# Patient Record
Sex: Female | Born: 1965 | Race: Black or African American | Hispanic: No | Smoking: Never smoker
Health system: Southern US, Community
[De-identification: ages and names within clinical notes are randomized; demographics above are authoritative.]

## PROBLEM LIST (undated history)

## (undated) DIAGNOSIS — Z8041 Family history of malignant neoplasm of ovary: Secondary | ICD-10-CM

## (undated) DIAGNOSIS — G43909 Migraine, unspecified, not intractable, without status migrainosus: Secondary | ICD-10-CM

## (undated) HISTORY — PX: ABDOMINAL HYSTERECTOMY: SHX81

## (undated) HISTORY — DX: Family history of malignant neoplasm of ovary: Z80.41

---

## 2007-03-15 ENCOUNTER — Ambulatory Visit: Payer: Self-pay | Admitting: Obstetrics & Gynecology

## 2008-07-06 ENCOUNTER — Ambulatory Visit: Payer: Self-pay | Admitting: Internal Medicine

## 2008-07-31 ENCOUNTER — Ambulatory Visit: Payer: Self-pay | Admitting: Internal Medicine

## 2008-08-06 ENCOUNTER — Ambulatory Visit: Payer: Self-pay | Admitting: Internal Medicine

## 2008-12-14 ENCOUNTER — Ambulatory Visit: Payer: Self-pay | Admitting: Obstetrics & Gynecology

## 2008-12-20 ENCOUNTER — Ambulatory Visit: Payer: Self-pay | Admitting: Obstetrics & Gynecology

## 2010-11-26 ENCOUNTER — Ambulatory Visit: Payer: Self-pay | Admitting: Obstetrics & Gynecology

## 2012-02-26 ENCOUNTER — Ambulatory Visit: Payer: Self-pay | Admitting: Otolaryngology

## 2013-01-23 ENCOUNTER — Ambulatory Visit: Payer: Self-pay | Admitting: Internal Medicine

## 2015-02-05 ENCOUNTER — Encounter: Payer: Self-pay | Admitting: *Deleted

## 2015-02-05 ENCOUNTER — Emergency Department
Admission: EM | Admit: 2015-02-05 | Discharge: 2015-02-05 | Disposition: A | Discharge status: Home / Self Care | Payer: Federal, State, Local not specified - PPO | Attending: Emergency Medicine | Admitting: Emergency Medicine

## 2015-02-05 DIAGNOSIS — G43809 Other migraine, not intractable, without status migrainosus: Secondary | ICD-10-CM | POA: Diagnosis not present

## 2015-02-05 DIAGNOSIS — R51 Headache: Secondary | ICD-10-CM | POA: Diagnosis present

## 2015-02-05 HISTORY — DX: Migraine, unspecified, not intractable, without status migrainosus: G43.909

## 2015-02-05 MED ORDER — SUMATRIPTAN SUCCINATE 6 MG/0.5ML ~~LOC~~ SOLN
6.0000 mg | Freq: Once | SUBCUTANEOUS | Status: AC
  Administered 2015-02-05: 6 mg via SUBCUTANEOUS
  Filled 2015-02-05: qty 0.5

## 2015-02-05 MED ORDER — METOCLOPRAMIDE HCL 5 MG/ML IJ SOLN
10.0000 mg | Freq: Once | INTRAMUSCULAR | Status: AC
  Administered 2015-02-05: 10 mg via INTRAVENOUS
  Filled 2015-02-05: qty 2

## 2015-02-05 MED ORDER — KETOROLAC TROMETHAMINE 30 MG/ML IJ SOLN
30.0000 mg | Freq: Once | INTRAMUSCULAR | Status: AC
  Administered 2015-02-05: 30 mg via INTRAVENOUS
  Filled 2015-02-05: qty 1

## 2015-02-05 MED ORDER — SODIUM CHLORIDE 0.9 % IV BOLUS (SEPSIS)
1000.0000 mL | Freq: Once | INTRAVENOUS | Status: AC
  Administered 2015-02-05: 1000 mL via INTRAVENOUS

## 2015-02-05 NOTE — ED Notes (Signed)
Headache for couple of days   No relief with po meds  No fever or trauma

## 2015-02-05 NOTE — ED Provider Notes (Signed)
[facility] Emergency Department Provider Note  ____________________________________________  Time seen: Approximately 4:58 PM  I have reviewed the triage vital signs and the nursing notes.   HISTORY  Chief Complaint Migraine    HPI Nichole Avila is a 49 y.o. female patient states she's having a headache for the last 3 days. Patient state the headache became worse this morning and has submitted to a migraine headache she's had years ago. Patient said this headache is unresolve ibuprofen. Patient states the headache is located in the front and right side. Patient stated there is photophobia associated with this headache but denies any nausea vomiting or vertigo. Patient was was seen at urgent care clinic and they sent her to the ER. Patient rating the pain as a 10 over 10. Patient is wearing sunshades inside.   Past Medical History  Diagnosis Date  . Migraine     There are no active problems to display for this patient.   Past Surgical History  Procedure Laterality Date  . Abdominal hysterectomy      No current outpatient prescriptions on file.  Allergies Codeine  No family history on file.  Social History History  Substance Use Topics  . Smoking status: Never Smoker   . Smokeless tobacco: Not on file  . Alcohol Use: No    Review of Systems Constitutional: No fever/chills Eyes: No visual changes. Patient complaining of photophobia. ENT: No sore throat. Cardiovascular: Denies chest pain. Respiratory: Denies shortness of breath. Gastrointestinal: No abdominal pain.  No nausea, no vomiting.  No diarrhea.  No constipation. Genitourinary: Negative for dysuria. Musculoskeletal: Negative for back pain. Skin: Negative for rash. Neurological: Positive for headaches, but denies focal weakness or numbness. Hematological/Lymphatic: Allergic/Immunilogical: Codeine 10-point ROS otherwise  negative.  ____________________________________________   PHYSICAL EXAM:  VITAL SIGNS: ED Triage Vitals  Enc Vitals Group     BP 02/05/15 1554 125/67 mmHg     Pulse Rate 02/05/15 1554 63     Resp 02/05/15 1554 18     Temp 02/05/15 1554 97.9 F (36.6 C)     Temp Source 02/05/15 1554 Oral     SpO2 02/05/15 1554 99 %     Weight 02/05/15 1554 151 lb (68.493 kg)     Height 02/05/15 1554 5\' 5"  (1.651 m)     Head Cir --      Peak Flow --      Pain Score 02/05/15 1555 10     Pain Loc --      Pain Edu? --      Excl. in [place]? --     Constitutional: Alert and oriented. Well appearing and in no acute distress. Eyes: Conjunctivae are normal. PERRL. EOMI. Head: Atraumatic. Nose: No congestion/rhinnorhea. Mouth/Throat: Mucous membranes are moist.  Oropharynx non-erythematous. Neck: No stridor. No cervical spine tenderness to palpation. Hematological/Lymphatic/Immunilogical: No cervical lymphadenopathy. Cardiovascular: Normal rate, regular rhythm. Grossly normal heart sounds.  Good peripheral circulation. Respiratory: Normal respiratory effort.  No retractions. Lungs CTAB. Gastrointestinal: Soft and nontender. No distention. No abdominal bruits. No CVA tenderness. Musculoskeletal: No lower extremity tenderness nor edema.  No joint effusions. Neurologic:  Normal speech and language. No gross focal neurologic deficits are appreciated. No gait instability. Skin:  Skin is warm, dry and intact. No rash noted. Psychiatric: Mood and affect are normal. Speech and behavior are normal.  ____________________________________________   LABS (all labs ordered are listed, but only abnormal results are displayed)  Labs Reviewed - No data to display ____________________________________________  EKG  ____________________________________________  RADIOLOGY   ____________________________________________   PROCEDURES  Procedure(s) performed: None  Critical Care performed:  No  ____________________________________________   INITIAL IMPRESSION / ASSESSMENT AND PLAN / ED COURSE  Pertinent labs & imaging results that were available during my care of the patient were reviewed by me and considered in my medical decision making (see chart for details).  Migraine headache almost completely resolved status post IV of Toradol and Reglan. She also given Imitrex subcutaneous. Patient states no medication needed for discharge since the headaches are very infrequent. Patient advised to follow-up family doctor determine if condition worsens. ____________________________________________   FINAL CLINICAL IMPRESSION(S) / ED DIAGNOSES  Final diagnoses:  Other type of migraine      Sable Feil, PA-C 02/05/15 [place], MD 02/06/15 [phone]

## 2015-02-05 NOTE — ED Notes (Signed)
Pt states she is having a migraine. She states she has had a "nagging" headache since Sunday, but this morning she felt like it turned into a migraine. She took two doses of ibuprofen without relief. C/o frontal and right headache, associated with photophobia. Denies n/v or dizziness. 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

## 2015-02-05 NOTE — ED Notes (Signed)
Pt here from [facility] with c/o headache, reports 10/10 pain. Hx of migraines, reports taking 800mg  of ibu 1200 with no relief.

## 2015-02-05 NOTE — Discharge Instructions (Signed)

## 2015-04-11 ENCOUNTER — Encounter: Payer: Self-pay | Admitting: *Deleted

## 2015-04-15 ENCOUNTER — Other Ambulatory Visit: Payer: Self-pay | Admitting: Obstetrics & Gynecology

## 2015-04-15 DIAGNOSIS — R928 Other abnormal and inconclusive findings on diagnostic imaging of breast: Secondary | ICD-10-CM

## 2015-04-16 ENCOUNTER — Other Ambulatory Visit: Payer: Self-pay | Admitting: *Deleted

## 2015-04-16 ENCOUNTER — Ambulatory Visit
Admission: RE | Admit: 2015-04-16 | Discharge: 2015-04-16 | Disposition: A | Discharge status: Home / Self Care | Payer: Federal, State, Local not specified - PPO | Source: Ambulatory Visit | Attending: Obstetrics & Gynecology | Admitting: Obstetrics & Gynecology

## 2015-04-16 ENCOUNTER — Other Ambulatory Visit: Payer: Self-pay | Admitting: Obstetrics & Gynecology

## 2015-04-16 ENCOUNTER — Ambulatory Visit
Admission: RE | Admit: 2015-04-16 | Discharge: 2015-04-16 | Disposition: A | Discharge status: Home / Self Care | Payer: Self-pay | Source: Ambulatory Visit | Attending: *Deleted | Admitting: *Deleted

## 2015-04-16 DIAGNOSIS — R928 Other abnormal and inconclusive findings on diagnostic imaging of breast: Secondary | ICD-10-CM

## 2015-04-16 DIAGNOSIS — R921 Mammographic calcification found on diagnostic imaging of breast: Secondary | ICD-10-CM | POA: Diagnosis not present

## 2015-04-16 DIAGNOSIS — Z9289 Personal history of other medical treatment: Secondary | ICD-10-CM

## 2015-04-18 ENCOUNTER — Other Ambulatory Visit: Payer: Self-pay | Admitting: Obstetrics & Gynecology

## 2015-04-18 DIAGNOSIS — R928 Other abnormal and inconclusive findings on diagnostic imaging of breast: Secondary | ICD-10-CM

## 2015-04-18 DIAGNOSIS — R921 Mammographic calcification found on diagnostic imaging of breast: Secondary | ICD-10-CM

## 2015-04-22 ENCOUNTER — Ambulatory Visit
Admission: RE | Admit: 2015-04-22 | Discharge: 2015-04-22 | Disposition: A | Discharge status: Home / Self Care | Payer: Federal, State, Local not specified - PPO | Source: Ambulatory Visit | Attending: Obstetrics & Gynecology | Admitting: Obstetrics & Gynecology

## 2015-04-22 DIAGNOSIS — R921 Mammographic calcification found on diagnostic imaging of breast: Secondary | ICD-10-CM

## 2015-04-22 DIAGNOSIS — R928 Other abnormal and inconclusive findings on diagnostic imaging of breast: Secondary | ICD-10-CM | POA: Diagnosis present

## 2015-04-22 HISTORY — PX: BREAST BIOPSY: SHX20

## 2015-04-23 LAB — SURGICAL PATHOLOGY

## 2015-04-29 ENCOUNTER — Ambulatory Visit: Payer: Self-pay | Admitting: General Surgery

## 2015-05-06 ENCOUNTER — Ambulatory Visit: Payer: Self-pay | Admitting: General Surgery

## 2015-07-11 NOTE — Addendum Note (Signed)
Encounter addended by: Wayne Both on: 07/11/2015  1:35 PM<BR>     Documentation filed: Charges VN

## 2015-11-15 ENCOUNTER — Other Ambulatory Visit: Payer: Self-pay | Admitting: Internal Medicine

## 2015-11-15 DIAGNOSIS — G44229 Chronic tension-type headache, not intractable: Secondary | ICD-10-CM

## 2015-11-27 ENCOUNTER — Ambulatory Visit: Payer: Federal, State, Local not specified - PPO

## 2015-12-04 ENCOUNTER — Ambulatory Visit: Payer: Federal, State, Local not specified - PPO

## 2016-10-14 DIAGNOSIS — M7711 Lateral epicondylitis, right elbow: Secondary | ICD-10-CM | POA: Insufficient documentation

## 2017-04-30 ENCOUNTER — Ambulatory Visit (INDEPENDENT_AMBULATORY_CARE_PROVIDER_SITE_OTHER): Payer: Federal, State, Local not specified - PPO | Admitting: Obstetrics & Gynecology

## 2017-04-30 ENCOUNTER — Encounter: Payer: Self-pay | Admitting: Obstetrics & Gynecology

## 2017-04-30 VITALS — BP 110/60 | Ht 65.0 in | Wt 142.0 lb

## 2017-04-30 DIAGNOSIS — Z124 Encounter for screening for malignant neoplasm of cervix: Secondary | ICD-10-CM

## 2017-04-30 DIAGNOSIS — Z1231 Encounter for screening mammogram for malignant neoplasm of breast: Secondary | ICD-10-CM

## 2017-04-30 DIAGNOSIS — Z0000 Encounter for general adult medical examination without abnormal findings: Secondary | ICD-10-CM | POA: Insufficient documentation

## 2017-04-30 DIAGNOSIS — Z8041 Family history of malignant neoplasm of ovary: Secondary | ICD-10-CM | POA: Insufficient documentation

## 2017-04-30 DIAGNOSIS — Z01419 Encounter for gynecological examination (general) (routine) without abnormal findings: Secondary | ICD-10-CM

## 2017-04-30 DIAGNOSIS — Z1211 Encounter for screening for malignant neoplasm of colon: Secondary | ICD-10-CM

## 2017-04-30 DIAGNOSIS — Z1239 Encounter for other screening for malignant neoplasm of breast: Secondary | ICD-10-CM

## 2017-04-30 NOTE — Progress Notes (Signed)
HPI:      [name] is a 51 y.o. G2P2002 who LMP was in the past as she is s/p [facility], she presents today for her annual examination.  The patient has no complaints today. The patient is sexually active. Herlast pap: was normal and last mammogram: approximate date 2016, abnormal calcification and had biopsy then and was normal.  The patient does perform self breast exams.  There is notable family history of breast or ovarian cancer in her family. The patient is not taking hormone replacement therapy. Patient denies post-menopausal vaginal bleeding.   Mild vasomotor sx's.  The patient has regular exercise: yes. The patient denies current symptoms of depression.    GYN Hx: Last Colonoscopy:never ago.  Last DEXA: never ago.    PMHx: Past Medical History:  Diagnosis Date  . Migraine    Past Surgical History:  Procedure Laterality Date  . ABDOMINAL HYSTERECTOMY    . BREAST BIOPSY Left 04/22/15   path pending   Family History  Problem Relation Age of Onset  . Ovarian cancer Mother 26   Social History  Substance Use Topics  . Smoking status: Never Smoker  . Smokeless tobacco: Never Used  . Alcohol use No   No current outpatient prescriptions on file. Allergies: Codeine  Review of Systems  Constitutional: Negative for chills, fever and malaise/fatigue.  HENT: Negative for congestion, sinus pain and sore throat.   Eyes: Negative for blurred vision and pain.  Respiratory: Negative for cough and wheezing.   Cardiovascular: Negative for chest pain and leg swelling.  Gastrointestinal: Negative for abdominal pain, constipation, diarrhea, heartburn, nausea and vomiting.  Genitourinary: Negative for dysuria, frequency, hematuria and urgency.  Musculoskeletal: Negative for back pain, joint pain, myalgias and neck pain.  Skin: Negative for itching and rash.  Neurological: Negative for dizziness, tremors and weakness.  Endo/Heme/Allergies: Does not bruise/bleed easily.    Psychiatric/Behavioral: Negative for depression. The patient is not nervous/anxious and does not have insomnia.     Objective: BP 110/60   Ht 5\' 5"  (1.651 m)   Wt 142 lb (64.4 kg)   BMI 23.63 kg/m   Filed Weights   04/30/17 0804  Weight: 142 lb (64.4 kg)   Body mass index is 23.63 kg/m. Physical Exam  Constitutional: She is oriented to person, place, and time. She appears well-developed and well-nourished. No distress.  Genitourinary: Rectum normal and vagina normal. Pelvic exam was performed with patient supine. There is no rash or lesion on the right labia. There is no rash or lesion on the left labia. Vagina exhibits no lesion. No bleeding in the vagina. Right adnexum does not display mass and does not display tenderness. Left adnexum does not display mass and does not display tenderness. Cervix does not exhibit motion tenderness, lesion or polyp.  Genitourinary Comments: Absent Uterus Normal cervix  HENT:  Head: Normocephalic and atraumatic. Head is without laceration.  Right Ear: Hearing normal.  Left Ear: Hearing normal.  Nose: No epistaxis.  No foreign bodies.  Mouth/Throat: Uvula is midline, oropharynx is clear and moist and mucous membranes are normal.  Eyes: Pupils are equal, round, and reactive to light.  Neck: Normal range of motion. Neck supple. No thyromegaly present.  Cardiovascular: Normal rate and regular rhythm.  Exam reveals no gallop and no friction rub.   No murmur heard. Pulmonary/Chest: Effort normal and breath sounds normal. No respiratory distress. She has no wheezes. Right breast exhibits no mass, no skin change and no tenderness. Left  breast exhibits no mass, no skin change and no tenderness.  Abdominal: Soft. Bowel sounds are normal. She exhibits no distension. There is no tenderness. There is no rebound.  Musculoskeletal: Normal range of motion.  Neurological: She is alert and oriented to person, place, and time. No cranial nerve deficit.  Skin: Skin is  warm and dry.  Psychiatric: She has a normal mood and affect. Judgment normal.  Vitals reviewed.   Assessment: Annual Exam 1. Annual physical exam   2. Screening for breast cancer   3. Screening for cervical cancer   4. Screen for colon cancer     Plan:            1.  Cervical Screening-  Pap smear done today, Pap smear schedule reviewed with patient  2. Breast screening- Exam annually and mammogram scheduled soon  3. Colonoscopy every 10 years, Hemoccult testing after age 62 Sch soon  4. Labs managed by PCP  5. Counseling for hormonal therapy: none, no change in therapy today    F/U  Return in about 1 year (around 04/30/2018) for Annual.  Barnett Applebaum, MD, [facility], [facility] 04/30/2017  8:21 AM

## 2017-04-30 NOTE — Patient Instructions (Addendum)
PAP every three years Mammogram every year    Call [phone] to schedule at [facility] Colonoscopy every 10 years    Will schedule with Dr Allen Norris or one of his partners Labs yearly (with PCP)

## 2017-05-04 LAB — IGP, APTIMA HPV
HPV Aptima: NEGATIVE
PAP SMEAR COMMENT: 0

## 2017-05-09 LAB — FECAL OCCULT BLOOD, IMMUNOCHEMICAL: Fecal Occult Bld: NEGATIVE

## 2017-05-09 LAB — SPECIMEN STATUS REPORT

## 2017-05-25 ENCOUNTER — Encounter: Payer: Self-pay | Admitting: Obstetrics and Gynecology

## 2017-06-08 ENCOUNTER — Ambulatory Visit
Admission: RE | Admit: 2017-06-08 | Discharge: 2017-06-08 | Disposition: A | Discharge status: Home / Self Care | Payer: Federal, State, Local not specified - PPO | Source: Ambulatory Visit | Attending: Obstetrics & Gynecology | Admitting: Obstetrics & Gynecology

## 2017-06-08 DIAGNOSIS — Z1231 Encounter for screening mammogram for malignant neoplasm of breast: Secondary | ICD-10-CM | POA: Insufficient documentation

## 2017-06-08 DIAGNOSIS — Z1239 Encounter for other screening for malignant neoplasm of breast: Secondary | ICD-10-CM

## 2017-06-09 ENCOUNTER — Encounter: Payer: Self-pay | Admitting: Obstetrics & Gynecology

## 2017-06-24 ENCOUNTER — Telehealth: Payer: Self-pay

## 2017-06-24 NOTE — Telephone Encounter (Signed)
Gastroenterology Pre-Procedure Review  Request Date:  Requesting Physician: Dr. Vicente Males   PATIENT REVIEW QUESTIONS: The patient responded to the following health history questions as indicated:    1. Are you having any GI issues? No  2. Do you have a personal history of Polyps? No  3. Do you have a family history of Colon Cancer or Polyps? No  4. Diabetes Mellitus? No  5. Joint replacements in the past 12 months? No  6. Major health problems in the past 3 months? No  7. Any artificial heart valves, MVP, or defibrillator? No     MEDICATIONS & ALLERGIES:    Patient reports the following regarding taking any anticoagulation/antiplatelet therapy:   Plavix, Coumadin, Eliquis, Xarelto, Lovenox, Pradaxa, Brilinta, or Effient? No  Aspirin? No   Patient confirms/reports the following medications:  No current outpatient medications on file.   No current facility-administered medications for this visit.     Patient confirms/reports the following allergies:  Allergies  Allergen Reactions  . Codeine Other (See Comments)    Pt does not remember    No orders of the defined types were placed in this encounter.   AUTHORIZATION INFORMATION Primary Insurance: 1D#: Group #:  Secondary Insurance: 1D#: Group #:  SCHEDULE INFORMATION: Date: 08/27/17 Time: Location: [place]

## 2017-06-28 ENCOUNTER — Other Ambulatory Visit: Payer: Self-pay

## 2017-06-28 DIAGNOSIS — Z1211 Encounter for screening for malignant neoplasm of colon: Secondary | ICD-10-CM

## 2017-08-27 ENCOUNTER — Ambulatory Visit: Payer: Federal, State, Local not specified - PPO | Admitting: Anesthesiology

## 2017-08-27 ENCOUNTER — Encounter: Payer: Self-pay | Admitting: *Deleted

## 2017-08-27 ENCOUNTER — Encounter
Admission: RE | Disposition: A | Discharge status: Home / Self Care | Payer: Self-pay | Source: Ambulatory Visit | Attending: Gastroenterology

## 2017-08-27 ENCOUNTER — Ambulatory Visit
Admission: RE | Admit: 2017-08-27 | Discharge: 2017-08-27 | Disposition: A | Discharge status: Home / Self Care | Payer: Federal, State, Local not specified - PPO | Source: Ambulatory Visit | Attending: Gastroenterology | Admitting: Gastroenterology

## 2017-08-27 DIAGNOSIS — Z1211 Encounter for screening for malignant neoplasm of colon: Secondary | ICD-10-CM | POA: Insufficient documentation

## 2017-08-27 DIAGNOSIS — D122 Benign neoplasm of ascending colon: Secondary | ICD-10-CM | POA: Diagnosis not present

## 2017-08-27 DIAGNOSIS — D123 Benign neoplasm of transverse colon: Secondary | ICD-10-CM | POA: Insufficient documentation

## 2017-08-27 HISTORY — PX: COLONOSCOPY WITH PROPOFOL: SHX5780

## 2017-08-27 SURGERY — COLONOSCOPY WITH PROPOFOL
Anesthesia: General

## 2017-08-27 MED ORDER — LIDOCAINE HCL (PF) 1 % IJ SOLN
INTRAMUSCULAR | Status: AC
  Administered 2017-08-27: 0.3 mL via INTRADERMAL
  Filled 2017-08-27: qty 2

## 2017-08-27 MED ORDER — PROPOFOL 500 MG/50ML IV EMUL
INTRAVENOUS | Status: DC | PRN
  Administered 2017-08-27: 150 ug/kg/min via INTRAVENOUS

## 2017-08-27 MED ORDER — LIDOCAINE HCL (PF) 1 % IJ SOLN
2.0000 mL | Freq: Once | INTRAMUSCULAR | Status: AC
  Administered 2017-08-27: 0.3 mL via INTRADERMAL

## 2017-08-27 MED ORDER — PROPOFOL 10 MG/ML IV BOLUS
INTRAVENOUS | Status: DC | PRN
  Administered 2017-08-27: 70 mg via INTRAVENOUS

## 2017-08-27 MED ORDER — PROPOFOL 500 MG/50ML IV EMUL
INTRAVENOUS | Status: AC
  Filled 2017-08-27: qty 50

## 2017-08-27 MED ORDER — SODIUM CHLORIDE 0.9 % IV SOLN
INTRAVENOUS | Status: DC
  Administered 2017-08-27: 1000 mL via INTRAVENOUS

## 2017-08-27 NOTE — H&P (Signed)
     Jonathon Bellows, MD [address], [place], [place], [place], [address] [address], [place], [place], [place], [address] Phone: [phone]  Fax: [phone]  Primary Care Physician:  Idelle Crouch, MD   Pre-Procedure History & Physical: HPI:  Nichole Avila is a 52 y.o. female is here for an colonoscopy.   Past Medical History:  Diagnosis Date  . Family history of ovarian cancer    genetic testing letter sent 11/18; declined testing in the past  . Migraine     Past Surgical History:  Procedure Laterality Date  . ABDOMINAL HYSTERECTOMY    . BREAST BIOPSY Left 04/22/15    stereo- neg    Prior to Admission medications   Not on File    Allergies as of 07/01/2017 - Review Complete 04/30/2017  Allergen Reaction Noted  . Codeine Other (See Comments) 02/05/2015    Family History  Problem Relation Age of Onset  . Ovarian cancer Mother 80  . Breast cancer Neg Hx     Social History   Socioeconomic History  . Marital status: Legally Separated    Spouse name: Not on file  . Number of children: Not on file  . Years of education: Not on file  . Highest education level: Not on file  Social Needs  . Financial resource strain: Not on file  . Food insecurity - worry: Not on file  . Food insecurity - inability: Not on file  . Transportation needs - medical: Not on file  . Transportation needs - non-medical: Not on file  Occupational History  . Not on file  Tobacco Use  . Smoking status: Never Smoker  . Smokeless tobacco: Never Used  Substance and Sexual Activity  . Alcohol use: No  . Drug use: No  . Sexual activity: Not Currently  Other Topics Concern  . Not on file  Social History Narrative  . Not on file    Review of Systems: See HPI, otherwise negative ROS  Physical Exam: BP 101/72   Pulse 79   Temp (!) 96.9 F (36.1 C) (Tympanic)   Resp 16   Ht 5\' 5"  (1.651 m)   Wt 144 lb (65.3 kg)   SpO2 100%   BMI 23.96 kg/m  General:   Alert,  pleasant  and cooperative in NAD Head:  Normocephalic and atraumatic. Neck:  Supple; no masses or thyromegaly. Lungs:  Clear throughout to auscultation, normal respiratory effort.    Heart:  +S1, +S2, Regular rate and rhythm, No edema. Abdomen:  Soft, nontender and nondistended. Normal bowel sounds, without guarding, and without rebound.   Neurologic:  Alert and  oriented x4;  grossly normal neurologically.  Impression/Plan: Nichole Avila is here for an colonoscopy to be performed for Screening colonoscopy average risk   Risks, benefits, limitations, and alternatives regarding  colonoscopy have been reviewed with the patient.  Questions have been answered.  All parties agreeable.   Jonathon Bellows, MD  08/27/2017, 10:30 AM

## 2017-08-27 NOTE — Anesthesia Preprocedure Evaluation (Signed)
Anesthesia Evaluation  Patient identified by MRN, date of birth, ID band Patient awake    Reviewed: Allergy & Precautions, H&P , NPO status , Patient's Chart, lab work & pertinent test results, reviewed documented beta blocker date and time   Airway Mallampati: II   Neck ROM: full    Dental  (+) Poor Dentition   Pulmonary neg pulmonary ROS,    Pulmonary exam normal        Cardiovascular negative cardio ROS Normal cardiovascular exam Rhythm:regular Rate:Normal     Neuro/Psych negative neurological ROS  negative psych ROS   GI/Hepatic negative GI ROS, Neg liver ROS,   Endo/Other  negative endocrine ROS  Renal/GU negative Renal ROS  negative genitourinary   Musculoskeletal   Abdominal   Peds  Hematology negative hematology ROS (+)   Anesthesia Other Findings Past Medical History: No date: Family history of ovarian cancer     Comment:  genetic testing letter sent 11/18; declined testing in               the past No date: Migraine Past Surgical History: No date: ABDOMINAL HYSTERECTOMY 04/22/15: BREAST BIOPSY; Left     Comment:   stereo- neg BMI    Body Mass Index:  23.96 kg/m     Reproductive/Obstetrics negative OB ROS                             Anesthesia Physical Anesthesia Plan  ASA: II  Anesthesia Plan: General   Post-op Pain Management:    Induction:   PONV Risk Score and Plan:   Airway Management Planned:   Additional Equipment:   Intra-op Plan:   Post-operative Plan:   Informed Consent: I have reviewed the patients History and Physical, chart, labs and discussed the procedure including the risks, benefits and alternatives for the proposed anesthesia with the patient or authorized representative who has indicated his/her understanding and acceptance.   Dental Advisory Given  Plan Discussed with: CRNA  Anesthesia Plan Comments:         Anesthesia Quick  Evaluation

## 2017-08-27 NOTE — Anesthesia Procedure Notes (Signed)
Date/Time: 08/27/2017 10:50 AM Performed by: Nelda Marseille, CRNA Pre-anesthesia Checklist: Patient identified, Emergency Drugs available, Suction available, Patient being monitored and Timeout performed Oxygen Delivery Method: Nasal cannula

## 2017-08-27 NOTE — Anesthesia Post-op Follow-up Note (Signed)
Anesthesia QCDR form completed.        

## 2017-08-27 NOTE — Op Note (Signed)
[facility] Gastroenterology Patient Name: Nichole Avila Procedure Date: 08/27/2017 10:42 AM MRN: [id number] Account #: [id number] Date of Birth: 01/20/1966 Admit Type: Outpatient Age: 52 Room: [facility] ENDO ROOM 4 Gender: Female Note Status: Finalized Procedure:            Colonoscopy Indications:          Screening for colorectal malignant neoplasm Providers:            Jonathon Bellows MD, MD Referring MD:         Leonie Douglas. Doy Hutching, MD (Referring MD) Medicines:            Monitored Anesthesia Care Complications:        No immediate complications. Procedure:            Pre-Anesthesia Assessment:                       - Prior to the procedure, a History and Physical was                        performed, and patient medications, allergies and                        sensitivities were reviewed. The patient's tolerance of                        previous anesthesia was reviewed.                       - The risks and benefits of the procedure and the                        sedation options and risks were discussed with the                        patient. All questions were answered and informed                        consent was obtained.                       - ASA Grade Assessment: II - A patient with mild                        systemic disease.                       After obtaining informed consent, the colonoscope was                        passed under direct vision. Throughout the procedure,                        the patient's blood pressure, pulse, and oxygen                        saturations were monitored continuously. The                        Colonoscope was introduced through the anus and  advanced to the the cecum, identified by the                        appendiceal orifice, IC valve and transillumination.                        The colonoscopy was performed with ease. The patient                        tolerated the procedure well. The  quality of the bowel                        preparation was good. Findings:      The perianal and digital rectal examinations were normal.      Three sessile polyps were found in the transverse colon and ascending       colon. The polyps were 4 to 6 mm in size. These polyps were removed with       a cold snare. Resection and retrieval were complete.      The exam was otherwise without abnormality on direct and retroflexion       views. Impression:           - Three 4 to 6 mm polyps in the transverse colon and in                        the ascending colon, removed with a cold snare.                        Resected and retrieved.                       - The examination was otherwise normal on direct and                        retroflexion views. Recommendation:       - Discharge patient to home (with escort).                       - Resume previous diet.                       - Continue present medications.                       - Await pathology results.                       - Repeat colonoscopy in 3 - 5 years for surveillance                        based on pathology results. Procedure Code(s):    --- Professional ---                       [phone], Colonoscopy, flexible; with removal of tumor(s),                        polyp(s), or other lesion(s) by snare technique Diagnosis Code(s):    --- Professional ---                       Z12.11,  Encounter for screening for malignant neoplasm                        of colon                       D12.3, Benign neoplasm of transverse colon (hepatic                        flexure or splenic flexure)                       D12.2, Benign neoplasm of ascending colon CPT copyright 2016 American Medical Association. All rights reserved. The codes documented in this report are preliminary and upon coder review may  be revised to meet current compliance requirements. Jonathon Bellows, MD Jonathon Bellows MD, MD 08/27/2017 11:07:17 AM This report has been signed  electronically. Number of Addenda: 0 Note Initiated On: 08/27/2017 10:42 AM Scope Withdrawal Time: 0 hours 14 minutes 59 seconds  Total Procedure Duration: 0 hours 18 minutes 1 second       [facility]

## 2017-08-27 NOTE — Transfer of Care (Signed)
Immediate Anesthesia Transfer of Care Note  Patient: Nichole Avila  Procedure(s) Performed: COLONOSCOPY WITH PROPOFOL (N/A )  Patient Location: PACU  Anesthesia Type:General  Level of Consciousness: awake, alert  and oriented  Airway & Oxygen Therapy: Patient Spontanous Breathing and Patient connected to nasal cannula oxygen  Post-op Assessment: Report given to RN and Post -op Vital signs reviewed and stable  Post vital signs: Reviewed and stable  Last Vitals:  Vitals:   08/27/17 1017 08/27/17 1114  BP: 101/72 94/68  Pulse:  89  Resp:    Temp:  (!) 36.1 C  SpO2:  100%    Last Pain:  Vitals:   08/27/17 1114  TempSrc: Tympanic         Complications: No apparent anesthesia complications

## 2017-08-30 LAB — SURGICAL PATHOLOGY

## 2017-08-31 ENCOUNTER — Encounter: Payer: Self-pay | Admitting: Gastroenterology

## 2017-09-01 NOTE — Anesthesia Postprocedure Evaluation (Signed)
Anesthesia Post Note  Patient: Nichole Avila  Procedure(s) Performed: COLONOSCOPY WITH PROPOFOL (N/A )  Patient location during evaluation: PACU Anesthesia Type: General Level of consciousness: awake and alert Pain management: pain level controlled Vital Signs Assessment: post-procedure vital signs reviewed and stable Respiratory status: spontaneous breathing, nonlabored ventilation, respiratory function stable and patient connected to nasal cannula oxygen Cardiovascular status: blood pressure returned to baseline and stable Postop Assessment: no apparent nausea or vomiting Anesthetic complications: no     Last Vitals:  Vitals:   08/27/17 1124 08/27/17 1134  BP: 95/76 105/84  Pulse: 78 82  Resp: 14 16  Temp:    SpO2: 100% 100%    Last Pain:  Vitals:   08/27/17 1114  TempSrc: Tympanic                 [name]

## 2017-09-05 ENCOUNTER — Encounter: Payer: Self-pay | Admitting: Gastroenterology

## 2018-07-27 ENCOUNTER — Telehealth: Payer: Self-pay

## 2018-07-27 DIAGNOSIS — Z1239 Encounter for other screening for malignant neoplasm of breast: Secondary | ICD-10-CM

## 2018-07-27 NOTE — Telephone Encounter (Signed)
Order placed.  She can call [phone] to schedule that day.

## 2018-07-27 NOTE — Telephone Encounter (Signed)
Pt aware.

## 2018-07-27 NOTE — Telephone Encounter (Signed)
Pt has appt scheduled for 08/15/2018.  Would like appt at [facility] for the same day.  [phone]

## 2018-08-15 ENCOUNTER — Encounter: Payer: Self-pay | Admitting: Obstetrics & Gynecology

## 2018-08-15 ENCOUNTER — Ambulatory Visit
Admission: RE | Admit: 2018-08-15 | Discharge: 2018-08-15 | Disposition: A | Discharge status: Home / Self Care | Payer: BLUE CROSS/BLUE SHIELD | Source: Ambulatory Visit | Attending: Obstetrics & Gynecology | Admitting: Obstetrics & Gynecology

## 2018-08-15 ENCOUNTER — Ambulatory Visit (INDEPENDENT_AMBULATORY_CARE_PROVIDER_SITE_OTHER): Payer: BLUE CROSS/BLUE SHIELD | Admitting: Obstetrics & Gynecology

## 2018-08-15 VITALS — BP 120/60 | Ht 65.0 in | Wt 144.0 lb

## 2018-08-15 DIAGNOSIS — Z1321 Encounter for screening for nutritional disorder: Secondary | ICD-10-CM

## 2018-08-15 DIAGNOSIS — Z131 Encounter for screening for diabetes mellitus: Secondary | ICD-10-CM

## 2018-08-15 DIAGNOSIS — Z01419 Encounter for gynecological examination (general) (routine) without abnormal findings: Secondary | ICD-10-CM

## 2018-08-15 DIAGNOSIS — Z1239 Encounter for other screening for malignant neoplasm of breast: Secondary | ICD-10-CM | POA: Diagnosis present

## 2018-08-15 DIAGNOSIS — Z13220 Encounter for screening for lipoid disorders: Secondary | ICD-10-CM

## 2018-08-15 DIAGNOSIS — Z1329 Encounter for screening for other suspected endocrine disorder: Secondary | ICD-10-CM

## 2018-08-15 NOTE — Progress Notes (Signed)
HPI:      [name] is a 53 y.o. G2P2002 who LMP was in the past, she presents today for her annual examination.  The patient has no complaints today. The patient is sexually active. Herlast pap: approximate date 2018 and was normal and last mammogram: approximate date 2019 and was normal.  The patient does perform self breast exams.  There is no notable family history of breast or ovarian cancer in her family. The patient is not taking hormone replacement therapy. Patient denies post-menopausal vaginal bleeding.   The patient has regular exercise: yes. The patient denies current symptoms of depression.    GYN Hx: Last Colonoscopy:1 year ago. Normal.  Last DEXA: never ago.    PMHx: Past Medical History:  Diagnosis Date  . Family history of ovarian cancer    genetic testing letter sent 11/18; declined testing in the past  . Migraine    Past Surgical History:  Procedure Laterality Date  . ABDOMINAL HYSTERECTOMY    . BREAST BIOPSY Left 04/22/15    stereo- neg  . COLONOSCOPY WITH PROPOFOL N/A 08/27/2017   Procedure: COLONOSCOPY WITH PROPOFOL;  Surgeon: Jonathon Bellows, MD;  Location: [facility] ENDOSCOPY;  Service: Gastroenterology;  Laterality: N/A;   Family History  Problem Relation Age of Onset  . Ovarian cancer Mother 68  . Breast cancer Neg Hx    Social History   Tobacco Use  . Smoking status: Never Smoker  . Smokeless tobacco: Never Used  Substance Use Topics  . Alcohol use: No  . Drug use: No   No current outpatient medications on file. Allergies: Codeine  Review of Systems  Constitutional: Negative for chills, fever and malaise/fatigue.  HENT: Negative for congestion, sinus pain and sore throat.   Eyes: Negative for blurred vision and pain.  Respiratory: Negative for cough and wheezing.   Cardiovascular: Negative for chest pain and leg swelling.  Gastrointestinal: Negative for abdominal pain, constipation, diarrhea, heartburn, nausea and vomiting.  Genitourinary:  Negative for dysuria, frequency, hematuria and urgency.  Musculoskeletal: Negative for back pain, joint pain, myalgias and neck pain.  Skin: Negative for itching and rash.  Neurological: Negative for dizziness, tremors and weakness.  Endo/Heme/Allergies: Does not bruise/bleed easily.  Psychiatric/Behavioral: Negative for depression. The patient is not nervous/anxious and does not have insomnia.    Objective: BP 120/60   Ht 5\' 5"  (1.651 m)   Wt 144 lb (65.3 kg)   BMI 23.96 kg/m   Filed Weights   08/15/18 0858  Weight: 144 lb (65.3 kg)   Body mass index is 23.96 kg/m. Physical Exam Constitutional:      General: She is not in acute distress.    Appearance: She is well-developed.  Genitourinary:     Pelvic exam was performed with patient supine.     Vagina, cervix and rectum normal.     No lesions in the vagina.     No vaginal bleeding.     No cervical polyp.     Uterus is absent.     No right or left adnexal mass present.     Right adnexa not tender.     Left adnexa not tender.  HENT:     Head: Normocephalic and atraumatic. No laceration.     Right Ear: Hearing normal.     Left Ear: Hearing normal.     Mouth/Throat:     Pharynx: Uvula midline.  Eyes:     Pupils: Pupils are equal, round, and reactive to light.  Neck:     Musculoskeletal: Normal range of motion and neck supple.     Thyroid: No thyromegaly.  Cardiovascular:     Rate and Rhythm: Normal rate and regular rhythm.     Heart sounds: No murmur. No friction rub. No gallop.   Pulmonary:     Effort: Pulmonary effort is normal. No respiratory distress.     Breath sounds: Normal breath sounds. No wheezing.  Chest:     Breasts:        Right: No mass, skin change or tenderness.        Left: No mass, skin change or tenderness.  Abdominal:     General: Bowel sounds are normal. There is no distension.     Palpations: Abdomen is soft.     Tenderness: There is no abdominal tenderness. There is no rebound.    Musculoskeletal: Normal range of motion.  Neurological:     Mental Status: She is alert and oriented to person, place, and time.     Cranial Nerves: No cranial nerve deficit.  Skin:    General: Skin is warm and dry.  Psychiatric:        Judgment: Judgment normal.  Vitals signs reviewed.   Assessment: Annual Exam 1. Women's annual routine gynecological examination   2. Screening for breast cancer   3. Screening for diabetes mellitus   4. Screening for thyroid disorder   5. Encounter for vitamin deficiency screening   6. Screening for cholesterol level    Plan:            1.  Cervical Screening-  Pap smear schedule reviewed with patient  2. Breast screening- Exam annually and mammogram scheduled  3. Colonoscopy every 10 years, Hemoccult testing after age 75  4. Labs Ordered today  5. Counseling for hormonal therapy: none  6. Dermatology screening exam recommended    F/U  Return in about 1 year (around 08/16/2019) for Annual.  Barnett Applebaum, MD, [facility], [facility] 08/15/2018  9:29 AM

## 2018-08-15 NOTE — Patient Instructions (Addendum)
PAP every three years Mammogram every year    at [facility] Colonoscopy every 10 years Labs yearly (with PCP) Dermatology consult recommended  Primary Care in the area This is not meant to be comprehensive list, but a resource for some providers that you can call for counseling or medication needs. If you have a recommendation, please let us know.   [facility]:  [phone]               Dr Miguel Aschoff               Dr Juanetta Beets               Dr Lavon Paganini  [facility]:  [phone] Dr Enid Derry      Dr Steele Sizer

## 2018-08-16 ENCOUNTER — Other Ambulatory Visit: Payer: Self-pay | Admitting: Obstetrics & Gynecology

## 2018-08-16 LAB — TSH: TSH: 1.46 u[IU]/mL (ref 0.450–4.500)

## 2018-08-16 LAB — LIPID PANEL
CHOL/HDL RATIO: 2.4 ratio (ref 0.0–4.4)
Cholesterol, Total: 118 mg/dL (ref 100–199)
HDL: 49 mg/dL (ref >39)
LDL CALC: 54 mg/dL (ref 0–99)
TRIGLYCERIDES: 74 mg/dL (ref 0–149)
VLDL Cholesterol Cal: 15 mg/dL (ref 5–40)

## 2018-08-16 LAB — HEMOGLOBIN A1C
Est. average glucose Bld gHb Est-mCnc: 105 mg/dL
HEMOGLOBIN A1C: 5.3 % (ref 4.8–5.6)

## 2018-08-16 LAB — VITAMIN D 25 HYDROXY (VIT D DEFICIENCY, FRACTURES): Vit D, 25-Hydroxy: 9.3 ng/mL — ABNORMAL LOW (ref 30.0–100.0)

## 2018-08-16 MED ORDER — VITAMIN D (ERGOCALCIFEROL) 1.25 MG (50000 UNIT) PO CAPS: 50000.0000 [IU] | ORAL_CAPSULE | ORAL | 2 refills | Status: DC

## 2018-08-16 NOTE — Progress Notes (Signed)
Labs and MMG discussed w pt  Normal Mammogram, and this is excellent news!  Recommend follow up yearly.  Vitamin D level is low and in need of supplementation. I recommend you take prescription level dosing of Vit D- 50,000 units Weekly- for 12 weeks, and then take 1000 U per day of over-the-counter Vitamin D thereafter.  We can recheck it again after you've done this for a couple of months. Vitamin D is important for bone health maintenance as well as other benefits.  Normal cholesterol results, continue a healthy diet and exercise routine.  Thyroid hormone levels are normal.  Testing for diabetes is normal, great news!  Barnett Applebaum, MD, [facility], [facility] 08/16/2018  10:43 AM

## 2018-08-22 ENCOUNTER — Encounter: Payer: Self-pay | Admitting: Obstetrics and Gynecology

## 2018-11-04 ENCOUNTER — Other Ambulatory Visit: Payer: Self-pay | Admitting: Obstetrics & Gynecology

## 2018-11-04 NOTE — Telephone Encounter (Signed)
Let her know that after 3 months of high dose therapy with this Rx, she needs to revert back to once daily 1000 U over the counter Vitamin D.  Thx.

## 2018-11-07 NOTE — Telephone Encounter (Signed)
Please advise. Thank you

## 2018-12-31 ENCOUNTER — Other Ambulatory Visit: Payer: Self-pay | Admitting: Family Medicine

## 2018-12-31 DIAGNOSIS — Z20822 Contact with and (suspected) exposure to covid-19: Secondary | ICD-10-CM

## 2018-12-31 NOTE — Progress Notes (Signed)
[id number] 

## 2019-01-06 LAB — NOVEL CORONAVIRUS, NAA: SARS-CoV-2, NAA: DETECTED — AB

## 2019-09-25 ENCOUNTER — Telehealth: Payer: Self-pay

## 2019-09-25 DIAGNOSIS — Z1231 Encounter for screening mammogram for malignant neoplasm of breast: Secondary | ICD-10-CM

## 2019-09-25 NOTE — Telephone Encounter (Signed)
Pt has an annual exam scheduled on 10/13/19 with [facility]. She would like to go ahead and get an order for her mammogram. She is moving and trying to get everything squared away.

## 2019-09-25 NOTE — Telephone Encounter (Signed)
Pt is aware and [place] number given

## 2019-09-29 ENCOUNTER — Ambulatory Visit: Payer: Self-pay | Attending: Internal Medicine

## 2019-09-29 DIAGNOSIS — Z23 Encounter for immunization: Secondary | ICD-10-CM

## 2019-09-29 NOTE — Progress Notes (Signed)
   Covid-19 Vaccination Clinic  Name:  Nichole Avila    MRN: [id number] DOB: 20-Feb-1966  09/29/2019  Nichole Avila was observed post Covid-19 immunization for 15 minutes without incident. She was provided with Vaccine Information Sheet and instruction to access the V-Safe system.   Nichole Avila was instructed to call 911 with any severe reactions post vaccine: Marland Kitchen Difficulty breathing  . Swelling of face and throat  . A fast heartbeat  . A bad rash all over body  . Dizziness and weakness   Immunizations Administered    Name Date Dose VIS Date Route   Pfizer COVID-19 Vaccine 09/29/2019 12:18 PM 0.3 mL 06/16/2019 Intramuscular   Manufacturer: [place]   Lot: [id number]   [place]: [id number]

## 2019-10-11 ENCOUNTER — Encounter: Payer: Self-pay | Admitting: Obstetrics & Gynecology

## 2019-10-11 ENCOUNTER — Ambulatory Visit
Admission: RE | Admit: 2019-10-11 | Discharge: 2019-10-11 | Disposition: A | Discharge status: Home / Self Care | Payer: Self-pay | Source: Ambulatory Visit | Attending: Obstetrics & Gynecology | Admitting: Obstetrics & Gynecology

## 2019-10-11 DIAGNOSIS — Z1231 Encounter for screening mammogram for malignant neoplasm of breast: Secondary | ICD-10-CM | POA: Insufficient documentation

## 2019-10-13 ENCOUNTER — Other Ambulatory Visit (HOSPITAL_COMMUNITY)
Admission: RE | Admit: 2019-10-13 | Discharge: 2019-10-13 | Disposition: A | Discharge status: Home / Self Care | Payer: Self-pay | Source: Ambulatory Visit | Attending: Obstetrics & Gynecology | Admitting: Obstetrics & Gynecology

## 2019-10-13 ENCOUNTER — Other Ambulatory Visit: Payer: Self-pay

## 2019-10-13 ENCOUNTER — Ambulatory Visit (INDEPENDENT_AMBULATORY_CARE_PROVIDER_SITE_OTHER): Payer: Self-pay | Admitting: Obstetrics & Gynecology

## 2019-10-13 ENCOUNTER — Encounter: Payer: Self-pay | Admitting: Obstetrics & Gynecology

## 2019-10-13 VITALS — BP 100/60 | Ht 65.0 in | Wt 148.0 lb

## 2019-10-13 DIAGNOSIS — Z01419 Encounter for gynecological examination (general) (routine) without abnormal findings: Secondary | ICD-10-CM

## 2019-10-13 DIAGNOSIS — E559 Vitamin D deficiency, unspecified: Secondary | ICD-10-CM

## 2019-10-13 DIAGNOSIS — Z90710 Acquired absence of both cervix and uterus: Secondary | ICD-10-CM

## 2019-10-13 DIAGNOSIS — Z8041 Family history of malignant neoplasm of ovary: Secondary | ICD-10-CM

## 2019-10-13 DIAGNOSIS — Z124 Encounter for screening for malignant neoplasm of cervix: Secondary | ICD-10-CM

## 2019-10-13 DIAGNOSIS — Z1211 Encounter for screening for malignant neoplasm of colon: Secondary | ICD-10-CM

## 2019-10-13 NOTE — Patient Instructions (Signed)
PAP every three years Mammogram every year Colonoscopy every 10 years Labs today recheck

## 2019-10-13 NOTE — Progress Notes (Signed)
HPI:      [name] is a 54 y.o. G2P2002 who LMP was in the past w prior [facility], she presents today for her annual examination.  The patient has no complaints today. The patient is sexually active. Herlast pap: approximate date 2018 and was normal and last mammogram: approximate date 10/2019 and was normal.  The patient does perform self breast exams.  There is no notable family history of breast or ovarian cancer in her family. The patient is not currently taking hormone replacement therapy. Patient denies post-menopausal vaginal bleeding.   The patient has regular exercise: yes. The patient denies current symptoms of depression.    GYN Hx: Last Colonoscopy:2 years ago. Normal.  Last DEXA: never ago.    PMHx: Past Medical History:  Diagnosis Date  . Family history of ovarian cancer    genetic testing letter sent 11/18, 2/20; declined testing in the past  . Migraine    Past Surgical History:  Procedure Laterality Date  . ABDOMINAL HYSTERECTOMY    . BREAST BIOPSY Left 04/22/15    stereo- neg- BENIGN MAMMARY TISSUE WITH FOCAL SCLEROSING ADENOSIS.   Marland Kitchen COLONOSCOPY WITH PROPOFOL N/A 08/27/2017   Procedure: COLONOSCOPY WITH PROPOFOL;  Surgeon: Jonathon Bellows, MD;  Location: [facility] ENDOSCOPY;  Service: Gastroenterology;  Laterality: N/A;   Family History  Problem Relation Age of Onset  . Ovarian cancer Mother 49  . Breast cancer Neg Hx    Social History   Tobacco Use  . Smoking status: Never Smoker  . Smokeless tobacco: Never Used  Substance Use Topics  . Alcohol use: No  . Drug use: No    Current Outpatient Medications:  Marland Kitchen  Vitamin D, Ergocalciferol, (DRISDOL) 1.25 MG (50000 UT) CAPS capsule, TAKE 1 CAPSULE (50,000 UNITS TOTAL) BY MOUTH EVERY 7 (SEVEN) DAYS. FOR 3 MONTHS., Disp: 4 capsule, Rfl: 2 Allergies: Codeine  Review of Systems  Constitutional: Negative for chills, fever and malaise/fatigue.  HENT: Negative for congestion, sinus pain and sore throat.   Eyes: Negative for  blurred vision and pain.  Respiratory: Negative for cough and wheezing.   Cardiovascular: Negative for chest pain and leg swelling.  Gastrointestinal: Negative for abdominal pain, constipation, diarrhea, heartburn, nausea and vomiting.  Genitourinary: Negative for dysuria, frequency, hematuria and urgency.  Musculoskeletal: Negative for back pain, joint pain, myalgias and neck pain.  Skin: Negative for itching and rash.  Neurological: Negative for dizziness, tremors and weakness.  Endo/Heme/Allergies: Does not bruise/bleed easily.  Psychiatric/Behavioral: Negative for depression. The patient is not nervous/anxious and does not have insomnia.     Objective: BP 100/60   Ht 5\' 5"  (1.651 m)   Wt 148 lb (67.1 kg)   BMI 24.63 kg/m   Filed Weights   10/13/19 0836  Weight: 148 lb (67.1 kg)   Body mass index is 24.63 kg/m. Physical Exam Constitutional:      General: She is not in acute distress.    Appearance: She is well-developed.  Genitourinary:     Pelvic exam was performed with patient supine.     Vagina and rectum normal.     No lesions in the vagina.     No vaginal bleeding.     No right or left adnexal mass present.     Right adnexa not tender.     Left adnexa not tender.     Genitourinary Comments: Absent Uterus Absent cervix Vaginal cuff well healed  HENT:     Head: Normocephalic and atraumatic. No laceration.  Right Ear: Hearing normal.     Left Ear: Hearing normal.     Mouth/Throat:     Pharynx: Uvula midline.  Eyes:     Pupils: Pupils are equal, round, and reactive to light.  Neck:     Thyroid: No thyromegaly.  Cardiovascular:     Rate and Rhythm: Normal rate and regular rhythm.     Heart sounds: No murmur. No friction rub. No gallop.   Pulmonary:     Effort: Pulmonary effort is normal. No respiratory distress.     Breath sounds: Normal breath sounds. No wheezing.  Chest:     Breasts:        Right: No mass, skin change or tenderness.        Left: No  mass, skin change or tenderness.  Abdominal:     General: Bowel sounds are normal. There is no distension.     Palpations: Abdomen is soft.     Tenderness: There is no abdominal tenderness. There is no rebound.  Musculoskeletal:        General: Normal range of motion.     Cervical back: Normal range of motion and neck supple.  Neurological:     Mental Status: She is alert and oriented to person, place, and time.     Cranial Nerves: No cranial nerve deficit.  Skin:    General: Skin is warm and dry.  Psychiatric:        Judgment: Judgment normal.  Vitals reviewed.     Assessment: Annual Exam 1. Women's annual routine gynecological examination   2. Screening for cervical cancer   3. Screen for colon cancer   4. Vitamin D deficiency     Plan:            1.  Cervical Screening-  Pap smear done today  2. Breast screening- Exam annually and mammogram scheduled  3. Colonoscopy every 10 years, Hemoccult testing after age 55  4. Labs Ordered today  5. Counseling for hormonal therapy: none              6. FRAX - FRAX score for assessing the 10 year probability for fracture calculated and discussed today.  Based on age and score today, DEXA is not currently scheduled.    F/U  Return in about 1 year (around 10/12/2020) for Annual.  Nichole Applebaum, MD, [facility], [facility] 10/13/2019  9:04 AM

## 2019-10-17 LAB — CYTOLOGY - PAP
Comment: NEGATIVE
Diagnosis: NEGATIVE
High risk HPV: NEGATIVE

## 2019-10-23 ENCOUNTER — Other Ambulatory Visit: Payer: Self-pay

## 2019-10-24 ENCOUNTER — Other Ambulatory Visit: Payer: Self-pay

## 2019-10-24 ENCOUNTER — Ambulatory Visit: Payer: Self-pay | Attending: Internal Medicine

## 2019-10-24 DIAGNOSIS — Z23 Encounter for immunization: Secondary | ICD-10-CM

## 2019-10-24 NOTE — Progress Notes (Signed)
   Covid-19 Vaccination Clinic  Name:  Nichole Avila    MRN: [id number] DOB: 26-Jan-1966  10/24/2019  [name] was observed post Covid-19 immunization for 15 minutes without incident. She was provided with Vaccine Information Sheet and instruction to access the V-Safe system.   [name] was instructed to call 911 with any severe reactions post vaccine: Marland Kitchen Difficulty breathing  . Swelling of face and throat  . A fast heartbeat  . A bad rash all over body  . Dizziness and weakness   Immunizations Administered    Name Date Dose VIS Date Route   Pfizer COVID-19 Vaccine 10/24/2019  8:55 AM 0.3 mL 08/30/2018 Intramuscular   Manufacturer: [employer]   Lot: [id number]   [place]: [id number]

## 2019-10-25 ENCOUNTER — Ambulatory Visit: Payer: Self-pay

## 2019-10-25 LAB — VITAMIN D 25 HYDROXY (VIT D DEFICIENCY, FRACTURES): Vit D, 25-Hydroxy: 52.4 ng/mL (ref 30.0–100.0)

## 2019-10-26 NOTE — Progress Notes (Signed)
Let her know Vit D level is excellent! Keep up the supplements and exercise to keep it that way

## 2019-10-26 NOTE — Progress Notes (Signed)
Pt aware.

## 2019-11-17 ENCOUNTER — Telehealth: Payer: Self-pay

## 2019-11-17 NOTE — Telephone Encounter (Signed)
Spoke w/patient. She is out of town and had to be seen for injury (IT BAND). Requesting rx for prednisone. Advised [facility] out of office. Advised to contact PCP as this is not a GYN issue.

## 2019-11-17 NOTE — Telephone Encounter (Signed)
Patient inquiring if [facility] will send in a rx for her. No other details given. [phone]

## 2021-09-14 IMAGING — MG DIGITAL SCREENING BILAT W/ TOMO W/ CAD
8 series · 8 of 24 positions shown · non-contrast
Comparison: Previous exam(s).

CLINICAL DATA: Screening.

EXAM:
DIGITAL SCREENING BILATERAL MAMMOGRAM WITH TOMO AND CAD

[L CC synth-2D]
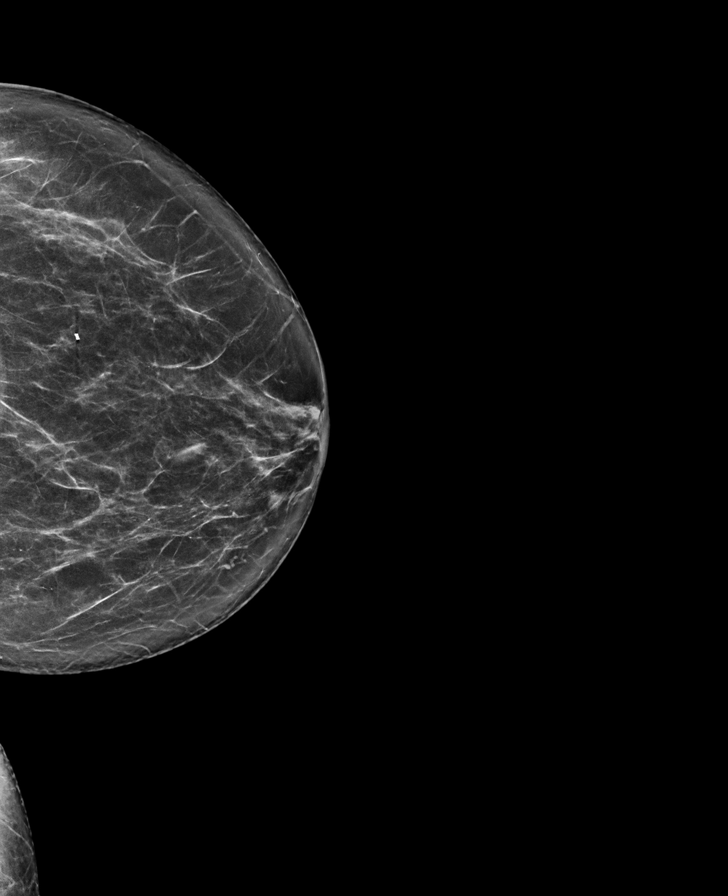

[L MLO synth-2D]
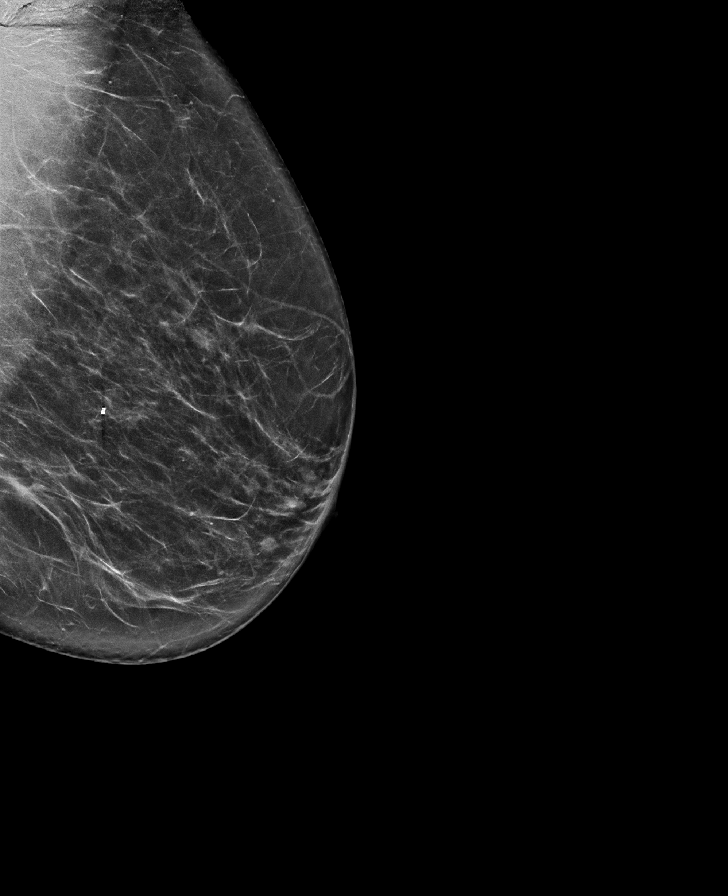

[R MLO synth-2D]
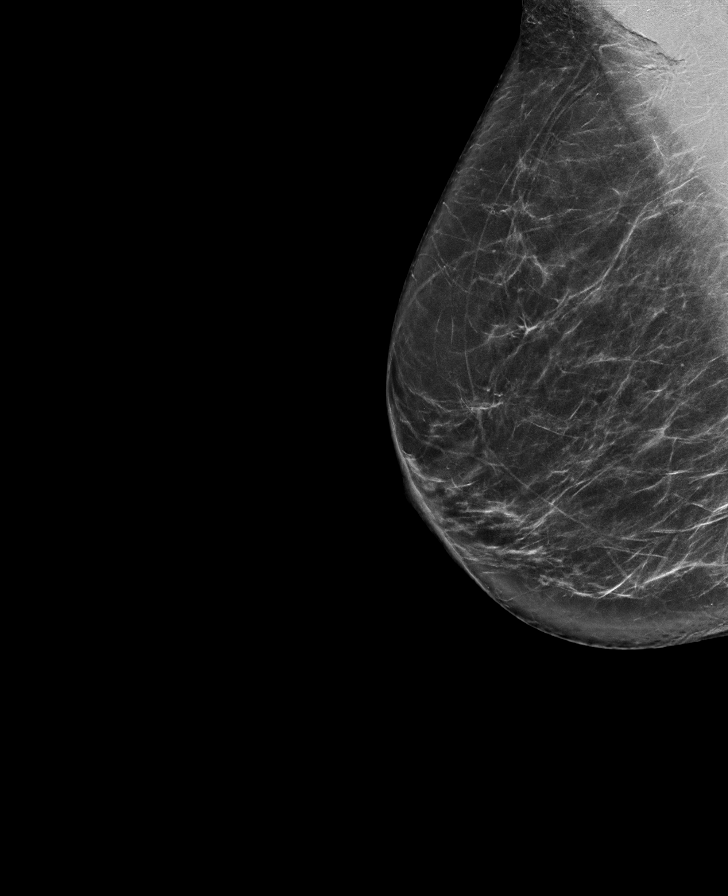

[R CC synth-2D]
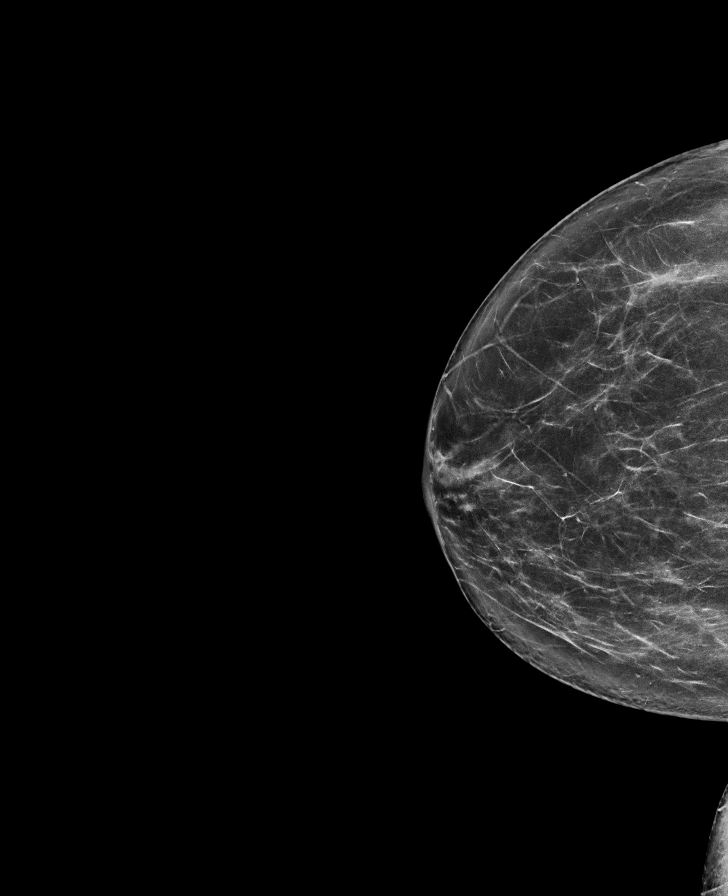

[L MLO tomo · tomo slice 39/77.0]
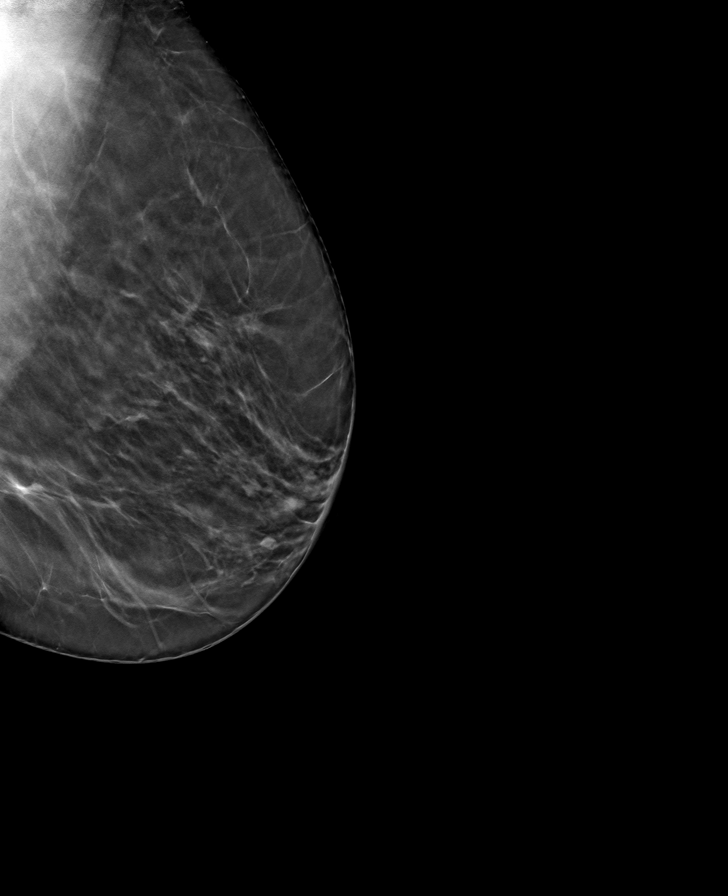

[R MLO tomo · tomo slice 39/78.0]
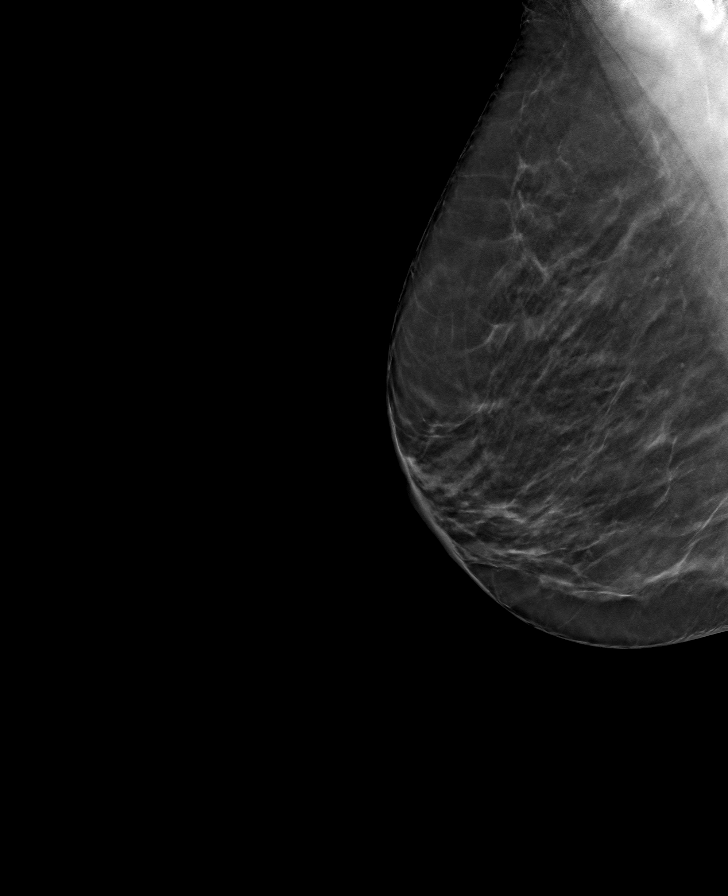

[L CC tomo · tomo slice 38/75.0]
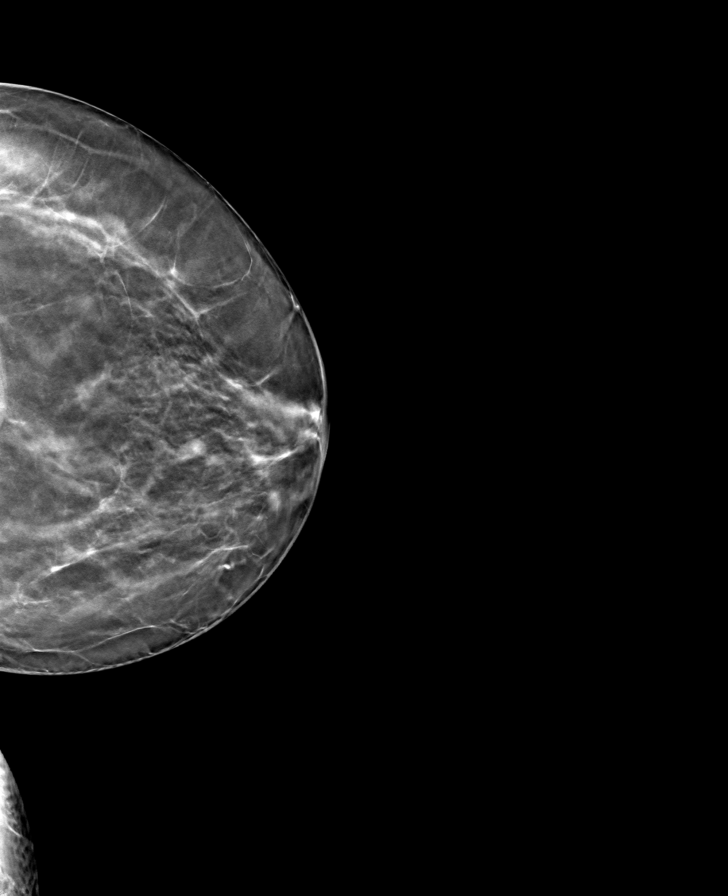

[R CC tomo · tomo slice 41/80.0]
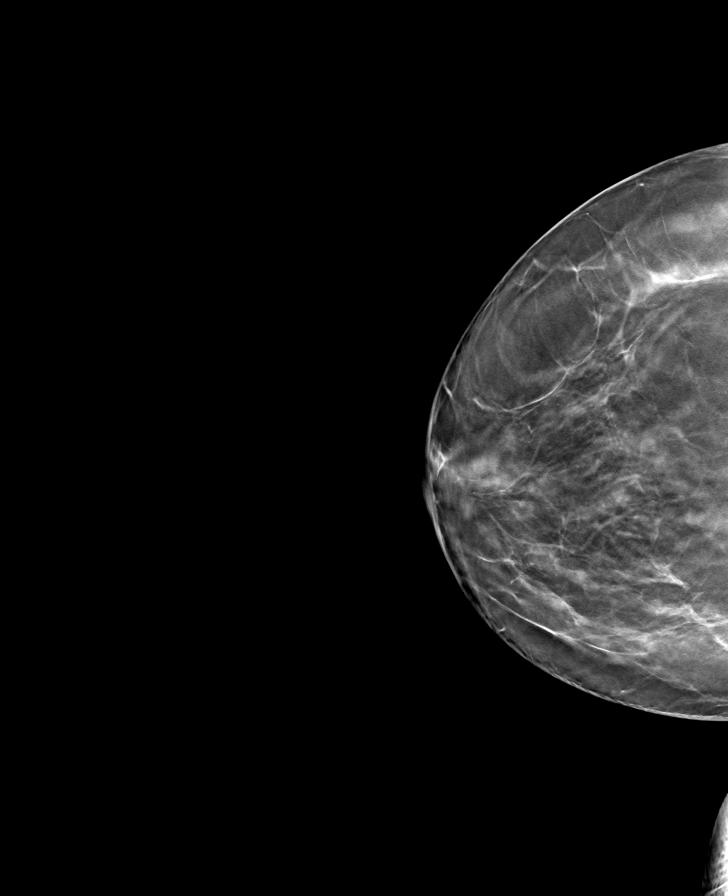

[8 of 24 positions shown; findings below may reference images not displayed]

ACR Breast Density Category b: There are scattered areas of
fibroglandular density.
FINDINGS: There are no findings suspicious for malignancy. Images were
processed with CAD.
IMPRESSION: No mammographic evidence of malignancy. A result letter of this
screening mammogram will be mailed directly to the patient.

RECOMMENDATION:
Screening mammogram in one year. (Code:[id number]-775)

BI-RADS CATEGORY  1: Negative.
